# Patient Record
Sex: Male | Born: 1970 | Race: Black or African American | Hispanic: No | Marital: Married | Smoking: Never smoker
Health system: Southern US, Community
[De-identification: ages and names within clinical notes are randomized; demographics above are authoritative.]

---

## 1998-03-31 ENCOUNTER — Emergency Department (HOSPITAL_COMMUNITY): Admission: EM | Admit: 1998-03-31 | Discharge: 1998-03-31 | Payer: Self-pay | Admitting: Internal Medicine

## 2004-08-25 ENCOUNTER — Encounter (INDEPENDENT_AMBULATORY_CARE_PROVIDER_SITE_OTHER): Payer: Self-pay | Admitting: *Deleted

## 2004-08-26 ENCOUNTER — Inpatient Hospital Stay (HOSPITAL_COMMUNITY): Admission: RE | Admit: 2004-08-26 | Discharge: 2004-08-27 | Payer: Self-pay | Admitting: Orthopedic Surgery

## 2006-02-26 ENCOUNTER — Emergency Department (HOSPITAL_COMMUNITY): Admission: EM | Admit: 2006-02-26 | Discharge: 2006-02-27 | Payer: Self-pay | Admitting: Emergency Medicine

## 2007-12-29 ENCOUNTER — Emergency Department (HOSPITAL_COMMUNITY): Admission: EM | Admit: 2007-12-29 | Discharge: 2007-12-30 | Payer: Self-pay | Admitting: Emergency Medicine

## 2008-05-27 ENCOUNTER — Emergency Department (HOSPITAL_COMMUNITY): Admission: EM | Admit: 2008-05-27 | Discharge: 2008-05-28 | Payer: Self-pay | Admitting: Emergency Medicine

## 2008-06-07 ENCOUNTER — Encounter: Admission: RE | Admit: 2008-06-07 | Discharge: 2008-06-07 | Payer: Self-pay | Admitting: Otolaryngology

## 2009-01-17 IMAGING — CT CT ORBIT/TEMPORAL/IAC W/O CM
4 of 6 series · 12 of 30 positions shown, 13 images · non-contrast
Comparison: None.

CLINICAL DATA: Chronic vertigo 3 years.  Question perilymphatic
fistula.

CT ORBIT/TEMPORAL WITHOUT CONTRAST
TECHNIQUE: Contiguous axial images were obtained from the base of
the skull through the vertex according to standard protocol without
contrast.  Multidetector CT imaging of the temporal bones was
performed.  Multiplanar CT image reconstructions were also
generated.

[Series 2: coronal · axial · 0.37mm/px · z∈[+20,+47]mm · 3 of 80 slices shown, 4 images]
[im 20/80  brain]
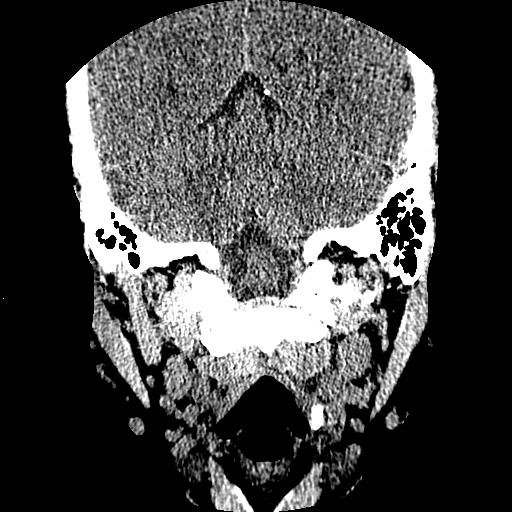
[im 20/80  bone]
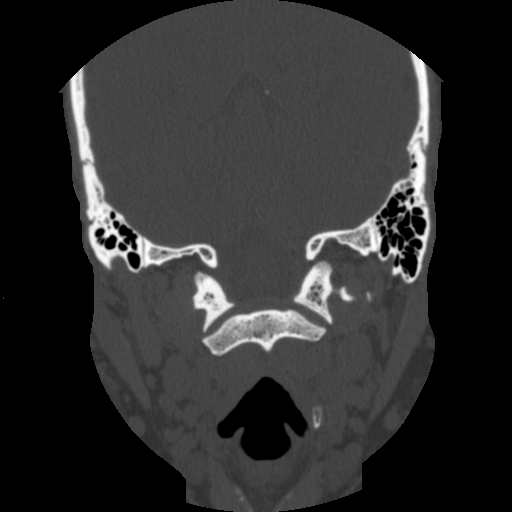
[im 40/80  bone]
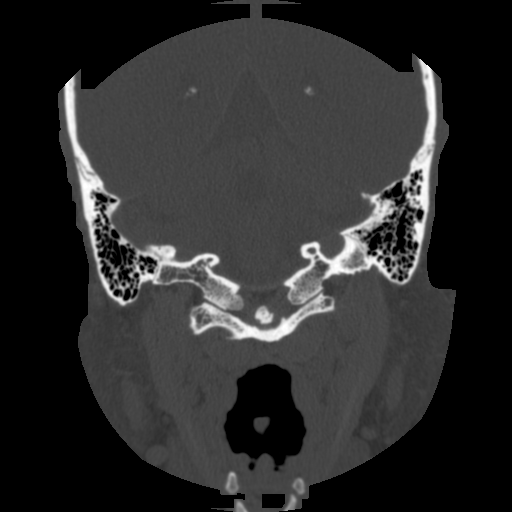
[im 60/80  bone]
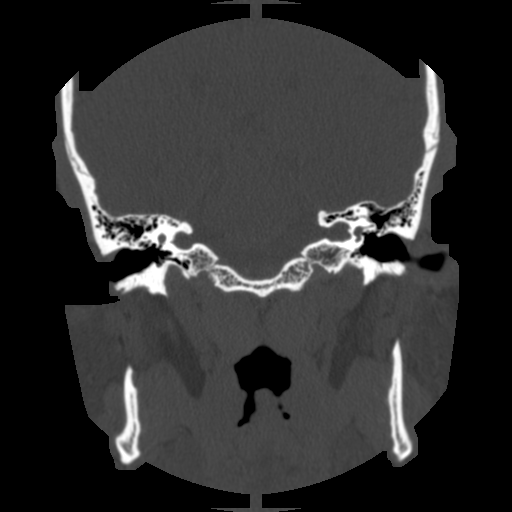

[Series 3: mag/rt/bone · axial · 0.20mm/px · z∈[+4,+31]mm · 3 of 80 slices shown]
[im 20/80  bone]
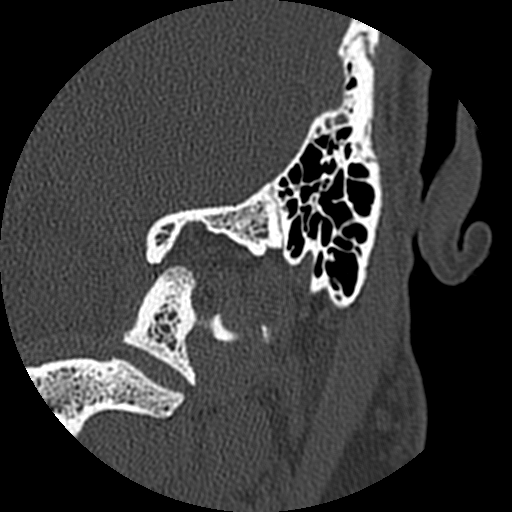
[im 40/80  bone]
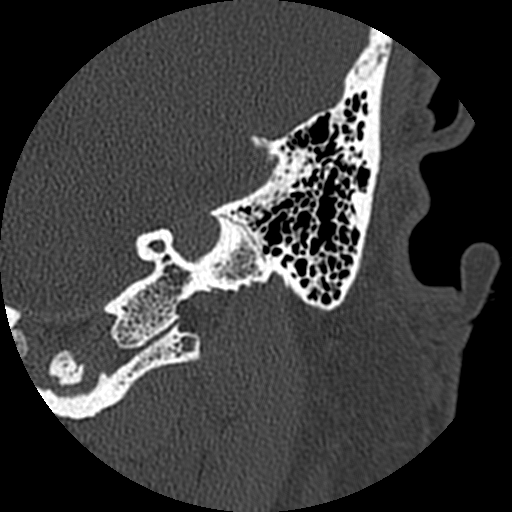
[im 60/80  bone]
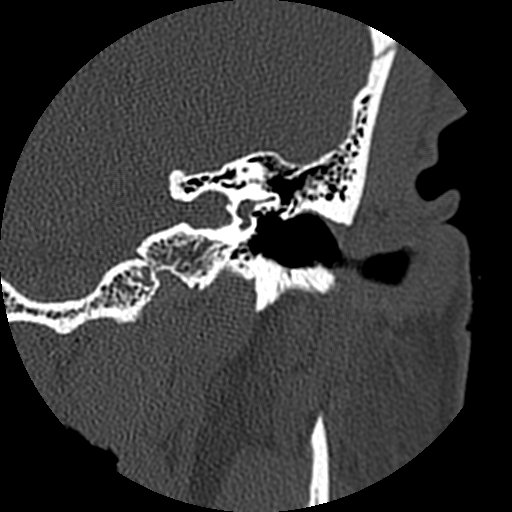

[Series 4: left/mag/bone · axial · 0.20mm/px · z∈[+4,+31]mm · 3 of 80 slices shown]
[im 20/80  bone]
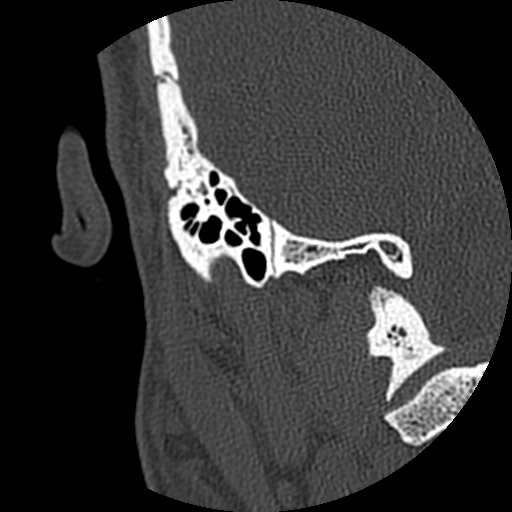
[im 40/80  bone]
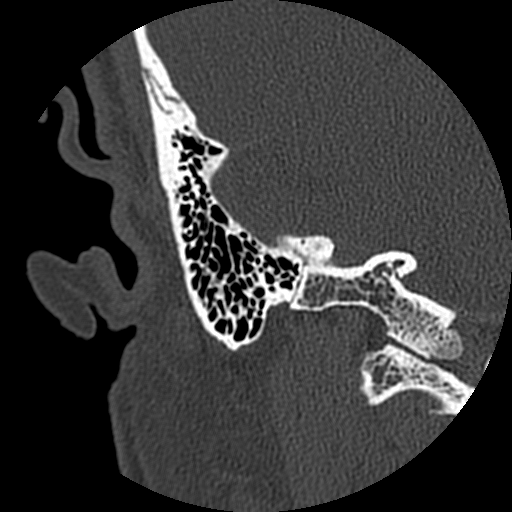
[im 60/80  bone]
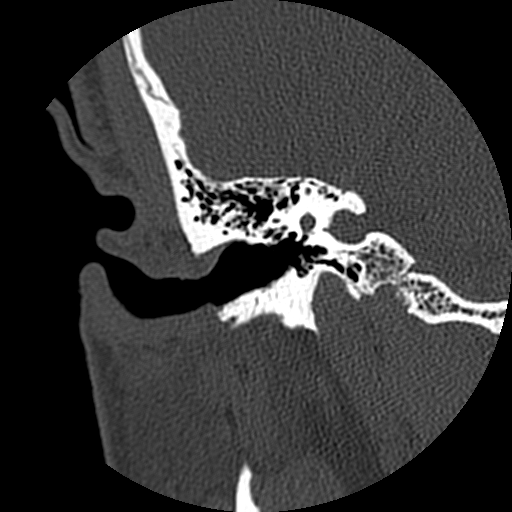

[Series 6: axial detail · axial · 0.37mm/px · z∈[-15,+10]mm · 3 of 80 slices shown]
[im 20/80  bone]
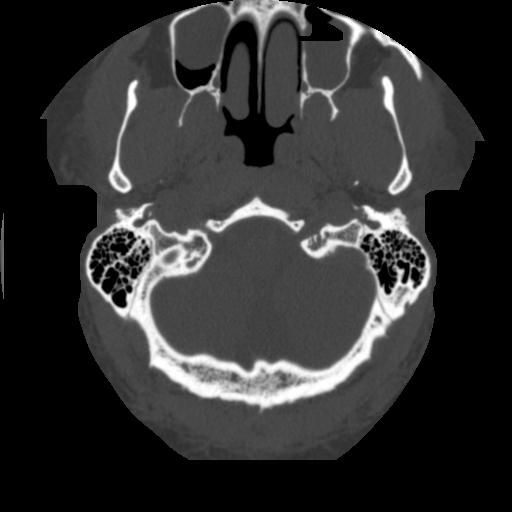
[im 40/80  bone]
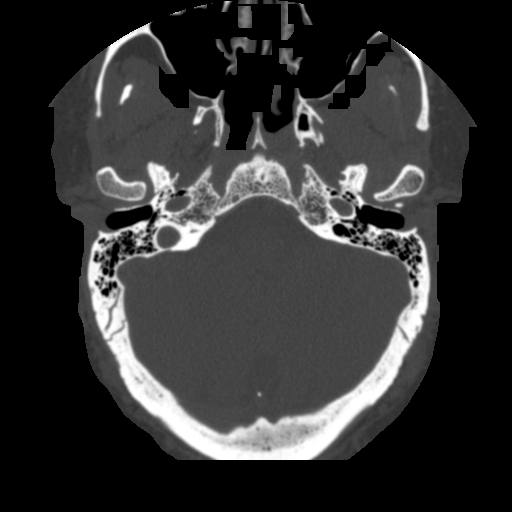
[im 60/80  bone]
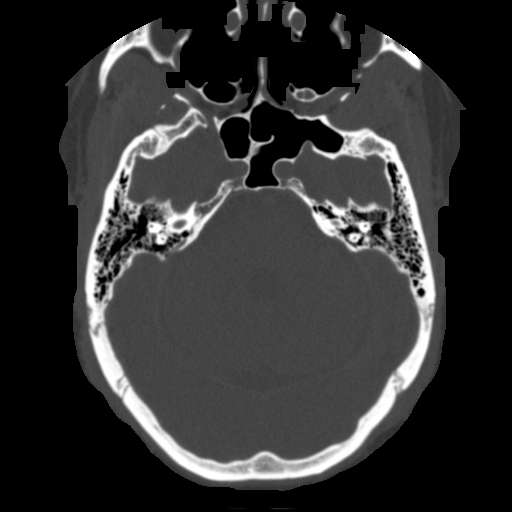

[12 of 30 positions shown; findings below may reference images not displayed]

FINDINGS: External canals widely patent.  No mastoid or middle ear
fluid.  Ossicles are symmetric and normal bilaterally.  No evidence
for cholesteatoma or bony destructive process.

Cochlea and vestibule symmetric and within normal limits.  No
evidence for dilated cochlear or vestibular aqueduct.  No evidence
for encephalocele or tegmen defect.  No evidence for aberrant
vascular structures or congenital deformity.
IMPRESSION: Negative CT temporal bone

## 2009-04-03 ENCOUNTER — Encounter (INDEPENDENT_AMBULATORY_CARE_PROVIDER_SITE_OTHER): Payer: Self-pay | Admitting: *Deleted

## 2009-04-08 ENCOUNTER — Encounter: Payer: Self-pay | Admitting: Family Medicine

## 2009-04-08 ENCOUNTER — Ambulatory Visit: Payer: Self-pay | Admitting: Family Medicine

## 2009-04-08 DIAGNOSIS — J301 Allergic rhinitis due to pollen: Secondary | ICD-10-CM

## 2009-04-08 DIAGNOSIS — I10 Essential (primary) hypertension: Secondary | ICD-10-CM

## 2009-04-10 LAB — CONVERTED CEMR LAB
CO2: 29 meq/L (ref 19–32)
Calcium: 9.7 mg/dL (ref 8.4–10.5)
Chloride: 98 meq/L (ref 96–112)
HDL: 38 mg/dL — ABNORMAL LOW (ref >39)
MCV: 86.3 fL (ref 78.0–100.0)
Platelets: 207 10*3/uL (ref 150–400)
Potassium: 3.7 meq/L (ref 3.5–5.3)
RBC: 5.05 M/uL (ref 4.22–5.81)
Sodium: 139 meq/L (ref 135–145)
Total CHOL/HDL Ratio: 3.9
VLDL: 31 mg/dL (ref 0–40)
WBC: 8.9 10*3/uL (ref 4.0–10.5)

## 2009-06-24 ENCOUNTER — Emergency Department (HOSPITAL_COMMUNITY): Admission: EM | Admit: 2009-06-24 | Discharge: 2009-06-24 | Payer: Self-pay | Admitting: Family Medicine

## 2009-07-16 ENCOUNTER — Telehealth: Payer: Self-pay | Admitting: Family Medicine

## 2009-08-27 ENCOUNTER — Ambulatory Visit: Payer: Self-pay | Admitting: Family Medicine

## 2009-08-27 DIAGNOSIS — K219 Gastro-esophageal reflux disease without esophagitis: Secondary | ICD-10-CM | POA: Insufficient documentation

## 2009-08-27 DIAGNOSIS — E663 Overweight: Secondary | ICD-10-CM | POA: Insufficient documentation

## 2009-08-28 ENCOUNTER — Telehealth: Payer: Self-pay | Admitting: Family Medicine

## 2009-09-25 ENCOUNTER — Telehealth: Payer: Self-pay | Admitting: Family Medicine

## 2009-11-28 ENCOUNTER — Ambulatory Visit: Payer: Self-pay | Admitting: Family Medicine

## 2009-11-28 DIAGNOSIS — G4700 Insomnia, unspecified: Secondary | ICD-10-CM

## 2009-11-28 DIAGNOSIS — F430 Acute stress reaction: Secondary | ICD-10-CM | POA: Insufficient documentation

## 2010-01-07 ENCOUNTER — Encounter: Payer: Self-pay | Admitting: Family Medicine

## 2010-06-29 ENCOUNTER — Telehealth: Payer: Self-pay | Admitting: Family Medicine

## 2010-08-04 ENCOUNTER — Telehealth (INDEPENDENT_AMBULATORY_CARE_PROVIDER_SITE_OTHER): Payer: Self-pay | Admitting: *Deleted

## 2010-08-07 ENCOUNTER — Ambulatory Visit: Payer: Self-pay | Admitting: Family Medicine

## 2010-10-29 NOTE — Assessment & Plan Note (Signed)
Summary: FLU SHOT/KH  Nurse Visit   Vital Signs:  Patient profile:   40 year old male Temp:     98.6 degrees F  Vitals Entered By: Theresia Lo RN (August 07, 2010 2:15 PM)  Allergies: No Known Drug Allergies  Immunizations Administered:  Influenza Vaccine # 1:    Vaccine Type: Fluvax 3+    Site: right deltoid    Mfr: Sanofi Pasteur    Dose: 0.5 ml    Route: IM    Given by: Theresia Lo RN    Exp. Date: 03/27/2011    Lot #: [id number]    VIS given: 04/21/10 version given August 07, 2010.  Flu Vaccine Consent Questions:    Do you have a history of severe allergic reactions to this vaccine? no    Any prior history of allergic reactions to egg and/or gelatin? no    Do you have a sensitivity to the preservative Thimersol? no    Do you have a past history of Guillan-Barre Syndrome? no    Do you currently have an acute febrile illness? no    Have you ever had a severe reaction to latex? no    Vaccine information given and explained to patient? yes  Orders Added: 1)  Flu Vaccine 20yrs + [90658] 2)  Admin 1st Vaccine [82956]

## 2010-10-29 NOTE — Assessment & Plan Note (Signed)
Summary: Stress/insomnia/HTN   Vital Signs:  Patient profile:   40 year old male Height:      70 inches Weight:      264.0 pounds BMI:     38.02 Temp:     98.0 degrees F oral Pulse rate:   84 / minute BP sitting:   133 / 89  (right arm) Cuff size:   large  Vitals Entered By: Gladstone Pih (November 28, 2009 10:38 AM) CC: C/O stress Is Patient Diabetic? No Pain Assessment Patient in pain? no        Primary Care Provider:  Marisue Ivan, MD  CC:  C/O stress.  History of Present Illness: 40yo M to discuss stress  Stress: Reports worsening stress x 1-2 months due to financial reasons.  His wife is unemployed and has not been able to find a job.  It is affecting his sleep, libido, and concentration.  No SI/HI.     Habits & Providers  Alcohol-Tobacco-Diet     Tobacco Status: never  Current Medications (verified): 1)  Chlorthalidone 25 Mg Tabs (Chlorthalidone) .... One Tablet By Mouth Daily 2)  Omeprazole 40 Mg Cpdr (Omeprazole) .Marland Kitchen.. 1 Tab By Mouth Daily 3)  Lunesta 2 Mg Tabs (Eszopiclone) .Marland Kitchen.. 1 Tab By Mouth At Bedtime As Needed Insomnia  Allergies (verified): No Known Drug Allergies  Social History: Smoking Status:  never  Review of Systems       It is affecting his sleep, libido, and concentration.  Physical Exam  General:  VS Reviewed. Well appearing, NAD.  Psych:  Endorses insomnia and lack of concentration no depression   Impression & Recommendations:  Problem # 1:  INSOMNIA (ICD-780.52) Assessment New  Acute.  Likely secondary to acute stress reaction.   We discussed his sleep hygiene.  No caffeine intake in the evening.  No activities in bed besides sex and sleep.  No late exercise.   Advised to continue with daily exercise. Will provide 1 month supply of Lunesta with 1 refill.  He has used this in the past for a short period without any adverse reactions.  His updated medication list for this problem includes:    Lunesta 2 Mg Tabs  (Eszopiclone) .Marland Kitchen... 1 tab by mouth at bedtime as needed insomnia  Orders: [facility]- Est  Level 4 (16109)  Problem # 2:  STRESS REACTION, ACUTE (ICD-308.9) Assessment: New  Situational.  Due to financial stresses. No harm to self or others. No signs of anxiety or depression. He has healthy coping mechanisms. He is to call me if symptoms worsen or change.  Orders: [facility]- Est  Level 4 (60454)  Problem # 3:  ESSENTIAL HYPERTENSION, BENIGN (ICD-401.1) Assessment: Unchanged  At goal (<140/90). No changes to regimen.  His updated medication list for this problem includes:    Chlorthalidone 25 Mg Tabs (Chlorthalidone) ..... One tablet by mouth daily  Orders: [facility]- Est  Level 4 (09811)  Complete Medication List: 1)  Chlorthalidone 25 Mg Tabs (Chlorthalidone) .... One tablet by mouth daily 2)  Omeprazole 40 Mg Cpdr (Omeprazole) .Marland Kitchen.. 1 tab by mouth daily 3)  Lunesta 2 Mg Tabs (Eszopiclone) .Marland Kitchen.. 1 tab by mouth at bedtime as needed insomnia  Patient Instructions: 1)  Call me if this continues despite the situation improving.   Prescriptions: CHLORTHALIDONE 25 MG TABS (CHLORTHALIDONE) one tablet by mouth daily  #90 x 1   Entered and Authorized by:   Marisue Ivan  MD   Signed by:   Marisue Ivan  MD  on 11/28/2009   Method used:   [facility]        [facility]  [address] [phone][facility] (retail)       [address]       [place], [place]  [id number]       Ph: [phone] or [phone]       Fax: [phone]   RxID:   [id number] LUNESTA 2 MG TABS (ESZOPICLONE) 1 tab by mouth at bedtime as needed insomnia  #30 x 1   Entered and Authorized by:   Marisue Ivan  MD   Signed by:   Marisue Ivan  MD on 11/28/2009   Method used:   Print then Give to Patient   RxID:   [id number]    Prevention & Chronic Care Immunizations   Influenza vaccine: refused  (04/08/2009)   Influenza vaccine deferral: Refused  (11/28/2009)   Influenza vaccine  due: 04/08/2010    Tetanus booster: 09/28/2007: given   Tetanus booster due: 09/27/2017    Pneumococcal vaccine: Not documented  Other Screening   Smoking status: never  (11/28/2009)  Lipids   Total Cholesterol: 147  (04/08/2009)   LDL: 78  (04/08/2009)   LDL Direct: Not documented   HDL: 38  (04/08/2009)   Triglycerides: 155  (04/08/2009)  Hypertension   Last Blood Pressure: 133 / 89  (11/28/2009)   Serum creatinine: 1.26  (04/08/2009)   Serum potassium 3.7  (04/08/2009)    Hypertension flowsheet reviewed?: Yes   Progress toward BP goal: At goal  Self-Management Support :   Personal Goals (by the next clinic visit) :      Personal blood pressure goal: 140/90  (08/27/2009)   Patient will work on the following items until the next clinic visit to reach self-care goals:     Medications and monitoring: take my medicines every day, check my blood pressure  (11/28/2009)     Eating: drink diet soda or water instead of juice or soda, eat more vegetables, use fresh or frozen vegetables, eat foods that are low in salt, eat baked foods instead of fried foods, eat fruit for snacks and desserts, limit or avoid alcohol  (08/27/2009)    Hypertension self-management support: Written self-care plan  (11/28/2009)   Hypertension self-care plan printed.

## 2010-10-29 NOTE — Progress Notes (Signed)
Summary: Rx Req  Phone Note Refill Request Call back at [facility] Phone [phone] Message from:  WIFE SONYA  Refills Requested: Medication #1:  CHLORTHALIDONE 25 MG TABS one tablet by mouth daily [facility]. [address]  PHONE NUMBER IS [phone]. FAX NUMBER IS [phone]  PT IS THERE FOR 6 WEEKS.    Initial call taken by: Clydell Hakim,  June 29, 2010 8:46 AM  Follow-up for Phone Call        to pcp Follow-up by: Golden Circle RN,  June 29, 2010 9:32 AM  Additional Follow-up for Phone Call Additional follow up Details #1::        Called in prescription to pharmacy Additional Follow-up by: Renold Don MD,  June 30, 2010 4:57 PM

## 2010-10-29 NOTE — Progress Notes (Signed)
  Phone Note Call from Patient   Caller: Spouse-Sonya Call For: [phone] Summary of Call: Neck pain x 3 days.  Need to be seen asap Initial call taken by: Abundio Miu,  August 04, 2010 1:44 PM  Follow-up for Phone Call        message left to call back Follow-up by: Theresia Lo RN,  August 04, 2010 1:54 PM  Additional Follow-up for Phone Call Additional follow up Details #1::        spoke with wife and she states patient ask her to call for appointment for Friday. PCP not in clinic Friday  explained that I can only make appointment 24 hours in advance  for work in. she doesn't know anything about his pain except that he has been taking ibuprofen for the pain and pain started 3 days ago. . he thinks maybe he has pulled a muscle.  she states he is at work now and cannot come today anyway. appointment scheduled for  tomorrow. Additional Follow-up by: Theresia Lo RN,  August 04, 2010 2:53 PM

## 2010-10-29 NOTE — Miscellaneous (Signed)
Summary: prior auth  Clinical Lists Changes a pa for omeprazole to pcp to complete & sign, or can change med.Golden Circle RN  January 07, 2010 11:01 AM  PA completed.......Marland KitchenMarisue Ivan, MD  Appended Document: prior auth approved by state health plan

## 2010-11-15 ENCOUNTER — Encounter: Payer: Self-pay | Admitting: *Deleted

## 2010-11-18 ENCOUNTER — Ambulatory Visit (INDEPENDENT_AMBULATORY_CARE_PROVIDER_SITE_OTHER): Payer: BC Managed Care – PPO | Admitting: Sports Medicine

## 2010-11-18 ENCOUNTER — Encounter: Payer: Self-pay | Admitting: Sports Medicine

## 2010-11-18 VITALS — BP 127/78 | HR 76 | Temp 97.8°F | Ht 72.0 in | Wt 278.6 lb

## 2010-11-18 DIAGNOSIS — M5416 Radiculopathy, lumbar region: Secondary | ICD-10-CM | POA: Insufficient documentation

## 2010-11-18 DIAGNOSIS — IMO0002 Reserved for concepts with insufficient information to code with codable children: Secondary | ICD-10-CM

## 2010-11-18 MED ORDER — GABAPENTIN 300 MG PO CAPS
ORAL_CAPSULE | ORAL | Status: AC
Start: 2010-11-18 — End: ?

## 2010-11-18 MED ORDER — TRAMADOL HCL 50 MG PO TABS: 50.0000 mg | ORAL_TABLET | Freq: Four times a day (QID) | ORAL | Status: AC | PRN

## 2010-11-18 MED ORDER — PREDNISONE (PAK) 10 MG PO TABS: ORAL_TABLET | Freq: Every day | ORAL | Status: AC

## 2010-11-18 NOTE — Patient Instructions (Addendum)
Prednisone 12d taper Gabapentin up taper Tramadol for pain as needed. Rehab exercises. Heating pad 20 mins 3x a day. Limit prolonged standing/sitting >20-30 mins. No stooping, heavy lifting >10 lbs.  Come back to see me if not getting better in 3 weeks.  -Dr. Karie Schwalbe.
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
## 2010-11-18 NOTE — Assessment & Plan Note (Addendum)
Suspect L2/L3 forminal impingement. No weakness or signs cord compression or cauda equina. Prednisone 12d taper Gabapentin up taper Tramadol. Rehab exercises. Heating pad 20 mins TID. Limit prolonged standing/sitting >20-30 mins. No stooping, heavy lifting >10 lbs.

## 2011-02-05 ENCOUNTER — Other Ambulatory Visit: Payer: Self-pay | Admitting: Sports Medicine

## 2011-02-05 MED ORDER — ESOMEPRAZOLE MAGNESIUM 40 MG PO CPDR: 40.0000 mg | DELAYED_RELEASE_CAPSULE | Freq: Every day | ORAL | Status: DC

## 2011-02-12 NOTE — Op Note (Signed)
NAMEKURT, Anthony Walsh NO.:  [id number]   MEDICAL RECORD NO.:  [id number]          PATIENT TYPE:  OIB   LOCATION:  5013                         FACILITY:  [facility]   PHYSICIAN:  Dionne Ano. Gramig III, M.D.DATE OF BIRTH:  01-Nov-1970   DATE OF PROCEDURE:  08/26/2004  DATE OF DISCHARGE:                                 OPERATIVE REPORT   PREOPERATIVE DIAGNOSIS:  Left ankle fracture subluxation with type C fibula  fracture, syndesmosis injury, medial clear space widening and deltoid  ligament injury.   POSTOPERATIVE DIAGNOSIS:  Left ankle fracture subluxation with type C fibula  fracture, syndesmosis injury, medial clear space widening and deltoid  ligament injury with associated chondral injury noted about the talar dome  medially and loose nonviable chondral fragments present in the joint.   OPERATION PERFORMED:  1.  Open reduction internal fixation of fibular fracture, left ankle.  2.  Syndesmosis screw fixation, left ankle secondary to syndesmosis injury.  3.  Deltoid ligament repair, left ankle.  4.  Arthrotomy with debridement, left ankle joint medially and removal of      nonviable chondral defects 3 x 2 mm in size times two.  5.  Stress radiography.   SURGEON:  Dionne Ano. Amanda Pea, M.D.   ASSISTANT:  Karie Chimera, P.A.-C.   ANESTHESIA:  General.   COMPLICATIONS:  None.   TOURNIQUET TIME:  Less than two hours.   ESTIMATED BLOOD LOSS:  Minimal.   SPECIMENS:  One.   INDICATIONS FOR PROCEDURE:  The patient presented to my office with a  fracture subluxation of his ankle.  He was noted to have widening of his  medial clear space and a type C fibular fracture with noted syndesmosis  injury.  I discussed with him, the options for treatment and prepped him for  surgery.  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  He presented to the holding area in stable condition without signs  of infection or dystrophy.  Prior to his  surgery, we splinted him in a  plaster of Paris cast.  I discussed with him the risks of bleeding,  infection, anesthesia, damage to normal structures and failure of the  surgery to accomplish its intended goals of relieving symptoms and restoring  function.  With this in mind, he desires to proceed.  All questions have  been encouraged and answered preoperatively.   OPERATIVE FINDINGS:  This patient had type C fibular fracture with  syndesmosis widening.  He underwent open reduction internal fixation of the  fibula.  Syndesmosis screw fixation due to the instability and repair of the  superficial deltoid ligament with associated ankle joint debridement  medially where there were chondral defects noted and loose bodies.  He  tolerated the procedure well and there were no complicating features.  He  had excellent stability at the conclusion of the case.   DESCRIPTION OF PROCEDURE:  The patient was seen by myself and anesthesia,  was taken to the operative suite, underwent prophylactic antibiotic  administration, was  laid supine on the operating table, underwent a general  anesthetic  under the direction of E. Jairo Ben, M.D., was laid supine  appropriately padded, had a bump placed under the left hip and then  underwent a thorough Betadine scrub and paint followed by drying process,  followed by placement of DuraPrep on the skin.  Once this was done, the  patient then had the leg elevated, sterile drapes were secured and  tourniquet was insufflated to 300 mmHg.  Once this was done, an incision was  made laterally, taking care to avoid superficial peroneal nerve branches and  sural nerve branches.  We made an incision and bluntly dissected down to the  fracture site.  Once this was done, retractors were placed.  The fracture  was cleaned off nicely and the fracture was then irrigated copiously.  This  patient had a long oblique fracture with type C distribution in terms of the   Weber classification. The patient had provisional fixation with clamps  applied followed by placement of interfragmentary screws across the oblique  site.  This was done with standard AO technique overdrilling the proximal  cortex with a 35 drill bit, the distal cortex was drilled with a 25 drill  bit and excellent compression of the fracture surfaces was obtained.  This  held the oblique portion nicely.  Given the man's very large ankle, I then  contoured a pelvic reconstruction plate off of the fibula allowing 6  cortices proximal to the fracture site and taking the plate down to the tip  of the fibula.  This plate was precontoured, placed against the side of the  fibula and served, of course, as the buttress and antirotation plate.  The  distal  holes were filled with fully threaded cancellous screws, the  proximal holes were bicortical screws.  Excellent purchase and excellent  contour was provided.  At this point I then stress tested the ankle with a  cotton test and noted that the medial clear space was still able to be  widened.  Thus I felt that syndesmosis screw fixation and look medially was  necessary.  Given the fact that he did not have perfect stability with the  stress test was an indication of syndesmosis injury and thus at this time we  made a medial incision, dissected down and noted that he did have widening  medially still present dynamically when loaded.  I removed superficial  deltoid ligament which was infolded slightly in the joint.  Following this,  I then debrided the joint, performed an arthrotomy so that I could look at  the talus.  Unfortunately the talus dome was scuffed by the injury  significantly and there were two loose chondral pieces, 2 x 3 mm in nature  which were removed without difficulty.  This was sent for specimen times  one.  The patient had the joint thoroughly irrigated.  I swept the area to make sure there was no infolded tissue and following  this, placed the ankle  in dorsiflexion, reduced the medial clear space and then placed a  syndesmosis screw from a remaining hole left free in the plate for this  purpose.  This was done by overdrilling the fibula cortices with 35 drill  bit and then engaging the tibia with a 25 drill bit.  Following this, I  placed a fully threaded cortical screw size 40 approximately with the ankle  reduced in the incisural notch.  The fibula was reduced nicely in the  incisural notch and the medial clear space was tightened nicely.  Following  this, I stress tested the ankle once again and it was noted to be in  excellent position.  I was pleased with this and the findings and at this  time repaired the deltoid ligament superficially with buried #2 FiberWire.  This was done tightly and without difficulty.  Following this, I then  deflated the tourniquet, obtained hemostasis with bipolar electrocautery and  regular cautery and irrigated copiously followed by a complex closure with 2-  0 Vicryl in the deep and superficial tissues over the lateral side of the  ankle followed by staple clips in the skin edge.  He closed nicely here.  The medial side was closed with Prolene at the skin edge after a few Vicryl  were placed in the subcu.  The patient tolerated this well and there no  complicating features.  He had excellent refill, good pulse, no signs of  compartment syndrome or other problems. We treated him with Xeroform  followed by Neosporin, followed by standard dressing and plaster of Paris  splint with derotation ankle stirrup utilizing plaster of Paris slabs.  The  patient tolerated this well, was extubated, taken to the recovery room and  monitored.  He will be given  additional Ancef and monitored closely.  He will return to the office and  see [place] in 10 days after his discharge from the hospital. He will be admitted  for observation and pain management, IV antibiotics etc.  I have discussed  with  the patient his findings, all issues, went over the x-rays as well.  All questions have been encouraged and answered.       WMG/MEDQ  D:  08/26/2004  T:  08/26/2004  Job:  [id number]

## 2011-02-15 ENCOUNTER — Telehealth: Payer: Self-pay | Admitting: *Deleted

## 2011-02-15 NOTE — Telephone Encounter (Signed)
PA required for omeprazole. Insurance allows # 90 tabs for 180 days . Will need to do a PA. Form placed in MD box.

## 2011-02-17 NOTE — Telephone Encounter (Signed)
Approval received from insurance for Omeprazole from 01/26/2011 thru 02/16/2012. Pharmacy notified

## 2011-06-14 ENCOUNTER — Other Ambulatory Visit: Payer: Self-pay | Admitting: Family Medicine

## 2011-06-14 MED ORDER — CHLORTHALIDONE 25 MG PO TABS: 25.0000 mg | ORAL_TABLET | Freq: Every day | ORAL | Status: DC

## 2011-08-02 ENCOUNTER — Ambulatory Visit (INDEPENDENT_AMBULATORY_CARE_PROVIDER_SITE_OTHER): Payer: BC Managed Care – PPO

## 2011-08-02 DIAGNOSIS — Z23 Encounter for immunization: Secondary | ICD-10-CM

## 2011-09-10 ENCOUNTER — Encounter: Payer: BC Managed Care – PPO | Admitting: Family Medicine

## 2011-09-23 ENCOUNTER — Other Ambulatory Visit: Payer: Self-pay | Admitting: Family Medicine

## 2011-09-24 NOTE — Telephone Encounter (Signed)
Refill request

## 2011-09-24 NOTE — Telephone Encounter (Signed)
Refill request for patient of Dr.Ritch 

## 2011-10-08 ENCOUNTER — Encounter: Payer: BC Managed Care – PPO | Admitting: Family Medicine

## 2011-12-27 ENCOUNTER — Other Ambulatory Visit: Payer: Self-pay | Admitting: Family Medicine

## 2012-03-10 ENCOUNTER — Other Ambulatory Visit: Payer: Self-pay | Admitting: Family Medicine

## 2012-03-10 MED ORDER — ESOMEPRAZOLE MAGNESIUM 40 MG PO CPDR: 40.0000 mg | DELAYED_RELEASE_CAPSULE | Freq: Every day | ORAL | Status: AC

## 2012-03-15 ENCOUNTER — Telehealth: Payer: Self-pay | Admitting: *Deleted

## 2012-03-15 NOTE — Telephone Encounter (Signed)
Attempted calling patient to notify about Nexium . All phone number listed are not in service. Will await call  from patient regarding this.

## 2012-03-15 NOTE — Telephone Encounter (Signed)
Does not qualify.  Will need to purchase OTC or make specific office appointment to discuss options.

## 2012-03-15 NOTE — Telephone Encounter (Signed)
PA required for Nexium. Form placed in MD box. 

## 2012-03-27 ENCOUNTER — Other Ambulatory Visit: Payer: Self-pay | Admitting: *Deleted

## 2012-03-31 ENCOUNTER — Telehealth: Payer: Self-pay | Admitting: Family Medicine

## 2012-03-31 MED ORDER — CHLORTHALIDONE 25 MG PO TABS: 25.0000 mg | ORAL_TABLET | Freq: Every day | ORAL | Status: AC

## 2012-03-31 NOTE — Telephone Encounter (Signed)
Patient's wife called for refill of chlorathalidone. She asked why his medication did not have additional refills. I informed her that patient has not been seen in some time for HTN f/u and he will need a f/u appt for additional refills. I informed her that I will send in a 30 days supply today to get him to his appt. She voiced understanding.

## 2012-04-29 ENCOUNTER — Other Ambulatory Visit: Payer: Self-pay | Admitting: Family Medicine

## 2012-05-05 ENCOUNTER — Other Ambulatory Visit: Payer: Self-pay | Admitting: Family Medicine

## 2012-08-25 ENCOUNTER — Ambulatory Visit (INDEPENDENT_AMBULATORY_CARE_PROVIDER_SITE_OTHER): Payer: BC Managed Care – PPO | Admitting: Family Medicine

## 2012-08-25 ENCOUNTER — Encounter: Payer: Self-pay | Admitting: Family Medicine

## 2012-08-25 VITALS — BP 138/80 | HR 77 | Temp 98.2°F | Resp 16 | Ht 70.5 in | Wt 255.0 lb

## 2012-08-25 DIAGNOSIS — B356 Tinea cruris: Secondary | ICD-10-CM

## 2012-08-25 DIAGNOSIS — B372 Candidiasis of skin and nail: Secondary | ICD-10-CM

## 2012-08-25 MED ORDER — CLOTRIMAZOLE 1 % EX CREA: TOPICAL_CREAM | Freq: Two times a day (BID) | CUTANEOUS | Status: AC

## 2012-08-25 MED ORDER — TERBINAFINE HCL 1 % EX CREA: TOPICAL_CREAM | Freq: Every day | CUTANEOUS | Status: DC

## 2012-08-25 NOTE — Patient Instructions (Signed)
See the handout given for information on tinea cruris.  Apply the cream each day for 1 week, then if not resolved, recheck, or follow up with your primary care doctor.  Return to the clinic or go to the nearest emergency room if any of your symptoms worsen or new symptoms occur.

## 2012-08-25 NOTE — Progress Notes (Signed)
  Subjective:    Patient ID: Anthony Walsh, male    DOB: December 17, 1970, 41 y.o.   MRN: [id number]  HPI Anthony Walsh is a 41 y.o. male ? Fungal infection - treated at primary provider 2 weeks ago - treated twice per day with nystatin cream for 2 weeks.  Didn't notice much improvement.  Tried terconazole? cream  - few nights ago - none in past 2 days.  some improvement with terconazole.. Notices rash on inner thighs and end of penis.  No dysuria, no scrotal lesions now - had this initially, no testicular pain. No other rash.   Axe body wash.   Review of Systems  Constitutional: Negative for fever, chills, diaphoresis, fatigue and unexpected weight change.  Genitourinary: Positive for genital sores (slight itching at end of penis with cracking skin. ). Negative for dysuria, frequency, discharge, penile swelling, scrotal swelling and difficulty urinating.  Skin: Positive for rash.   Engineer, drilling.      Objective:   Physical Exam  Vitals reviewed. Constitutional: He is oriented to person, place, and time. He appears well-developed.  Pulmonary/Chest: Effort normal.  Neurological: He is alert and oriented to person, place, and time.  Skin:     Psychiatric: He has a normal mood and affect. His behavior is normal.        Assessment & Plan:  Anthony Walsh is a 41 y.o. male 1. Tinea cruris  terbinafine (LAMISIL AT) 1 % cream   possible candidal infection with scrotal/penile involvement. Now improving somewhat after single dose of azole?  Will treat with topicalazole QD for 1 week (initially discussed terbinafine, but clotrimazole appears to be better option for tinea and candida. Advised pharmacist and left message on patient's cell phone number provided). handout given on tinea prevention and treatment, and recheck in next 1 week if not improving.  Sooner if worse.  Patient Instructions  See the handout given for information on tinea cruris.  Apply the cream each day  for 1 week, then if not resolved, recheck, or follow up with your primary care doctor.  Return to the clinic or go to the nearest emergency room if any of your symptoms worsen or new symptoms occur.

## 2018-06-29 ENCOUNTER — Ambulatory Visit (INDEPENDENT_AMBULATORY_CARE_PROVIDER_SITE_OTHER): Payer: BC Managed Care – PPO | Admitting: Neurology

## 2018-06-29 ENCOUNTER — Ambulatory Visit: Payer: BC Managed Care – PPO | Admitting: Neurology

## 2018-06-29 DIAGNOSIS — G5603 Carpal tunnel syndrome, bilateral upper limbs: Secondary | ICD-10-CM | POA: Diagnosis not present

## 2018-06-29 DIAGNOSIS — Z0289 Encounter for other administrative examinations: Secondary | ICD-10-CM

## 2018-06-29 NOTE — Progress Notes (Signed)
Full Name: Anthony Walsh Gender: Male MRN #: [id number] Date of Birth: 02-May-2071    Visit Date: 06/29/18 08:10 Age: 47 Years 0 Months Old Examining Physician: Naomie Dean, MD  Referring Physician: Lupe Carney, MD    History:  Patient referred for evaluation of Carpal Tunnel Syndrome  Summary: EMG/NCS was performed on the bilateral upper extremities.  The right median APB motor nerve showed prolonged distal onset latency (6.5 ms, N<4.4). The left  median APB motor nerve showed prolonged distal onset latency (4.7 ms, N<4.4). The right Median 2nd Digit orthodromic sensory nerve showed prolonged distal peak latency (5.35ms, N<3.4) and reduced amplitude(2 uV, N>10). The left  Median 2nd Digit orthodromic sensory nerve showed prolonged distal peak latency (4.7 ms, N<3.4) and reduced amplitude(4 uV, N>10)  The right median/ulnar (palm) comparison nerve showed prolonged distal peak latency (Median Palm, 4.3 ms, N<2.2) and abnormal peak latency difference (Median Palm-Ulnar Palm, 2.2 ms, N<0.4) with a relative median delay.    The left median/ulnar (palm) comparison nerve showed prolonged distal peak latency (Median Palm, 3.6 ms, N<2.2) and abnormal peak latency difference (Median Palm-Ulnar Palm, 1.8 ms, N<0.4) with a relative median delay.    All remaining nerves (as detailed in the following table) were within normal limits. All muscles within normal limits.  Conclusion: There is bilateral right > left Carpal Tunnel Syndrome.   Cc: Lupe Carney, MD ------------------------------- Naomie Dean, M.D.  [facility] [address] [place], [place] [address] Tel: [phone] Fax: [phone]        [facility]    Nerve / Sites Muscle Latency Ref. Amplitude Ref. Rel Amp Segments Distance Velocity Ref. Area    ms ms mV mV %  cm m/s m/s mVms  L Median - APB     Wrist APB 4.7 ?4.4 6.6 ?4.0 100 Wrist - APB 7   24.6     Upper arm APB 9.8  6.4  96.3 Upper arm - Wrist 25  49 ?49 24.3  R Median - APB     Wrist APB 6.5 ?4.4 6.2 ?4.0 100 Wrist - APB 7   24.6     Upper arm APB 11.5  5.9  94.6 Upper arm - Wrist 25 50 ?49 24.3  L Ulnar - ADM     Wrist ADM 2.7 ?3.3 11.8 ?6.0 100 Wrist - ADM 7   33.2     B.Elbow ADM 7.3  10.7  91.2 B.Elbow - Wrist 23 50 ?49 32.4     A.Elbow ADM 9.1  10.5  97.7 A.Elbow - B.Elbow 10 55 ?49 32.0         A.Elbow - Wrist      R Ulnar - ADM     Wrist ADM 2.6 ?3.3 12.2 ?6.0 100 Wrist - ADM 7   33.2     B.Elbow ADM 7.1  11.1  90.7 B.Elbow - Wrist 23 50 ?49 32.6     A.Elbow ADM 9.0  10.6  95.5 A.Elbow - B.Elbow 10 55 ?49 31.6         A.Elbow - Wrist                 SNC    Nerve / Sites Rec. Site Peak Lat Ref.  Amp Ref. Segments Distance Peak Diff Ref.    ms ms V V  cm ms ms  L Median, Ulnar - Transcarpal comparison     Median Palm Wrist 3.6 ?2.2 10 ?35 Median Palm -  Wrist 8       Ulnar Palm Wrist 2.0 ?2.2 8 ?12 Ulnar Palm - Wrist 8          Median Palm - Ulnar Palm  1.6 ?0.4  R Median, Ulnar - Transcarpal comparison     Median Palm Wrist 4.3 ?2.2 5 ?35 Median Palm - Wrist 8       Ulnar Palm Wrist 2.1 ?2.2 9 ?12 Ulnar Palm - Wrist 8          Median Palm - Ulnar Palm  2.2 ?0.4  L Median - Orthodromic (Dig II, Mid palm)     Dig II Wrist 4.7 ?3.4 4 ?10 Dig II - Wrist 13    R Median - Orthodromic (Dig II, Mid palm)     Dig II Wrist 5.8 ?3.4 2 ?10 Dig II - Wrist 13    L Ulnar - Orthodromic, (Dig V, Mid palm)     Dig V Wrist 2.8 ?3.1 6 ?5 Dig V - Wrist 11    R Ulnar - Orthodromic, (Dig V, Mid palm)     Dig V Wrist 2.8 ?3.1 13 ?5 Dig V - Wrist 31                   F  Wave    Nerve F Lat Ref.   ms ms  L Ulnar - ADM 31.9 ?32.0  R Ulnar - ADM 31.2 ?32.0         EMG full       EMG Summary Table    Spontaneous MUAP Recruitment  Muscle IA Fib PSW Fasc Other Amp Dur. Poly Pattern  L. Deltoid Normal None None None _______ Normal Normal Normal Normal  R. Deltoid Normal None None None _______ Normal Normal Normal Normal  L. Triceps  brachii Normal None None None _______ Normal Normal Normal Normal  R. Triceps brachii Normal None None None _______ Normal Normal Normal Normal  L. Pronator teres Normal None None None _______ Normal Normal Normal Normal  R. Pronator teres Normal None None None _______ Normal Normal Normal Normal  L. First dorsal interosseous Normal None None None _______ Normal Normal Normal Normal  R. First dorsal interosseous Normal None None None _______ Normal Normal Normal Normal  L. Opponens pollicis Normal None None None _______ Normal Normal Normal Normal  R. Opponens pollicis Normal None None None _______ Normal Normal Normal Normal

## 2018-06-29 NOTE — Progress Notes (Signed)
See procedure note.

## 2018-07-03 DIAGNOSIS — G5603 Carpal tunnel syndrome, bilateral upper limbs: Secondary | ICD-10-CM | POA: Insufficient documentation

## 2018-07-03 NOTE — Procedures (Signed)
Full Name: Anthony Walsh Gender: Male MRN #: [id number] Date of Birth: Mar 20, 2071    Visit Date: 06/29/18 08:10 Age: 47 Years 0 Months Old Examining Physician: Naomie Dean, MD  Referring Physician: Lupe Carney, MD    History:  Patient referred for evaluation of Carpal Tunnel Syndrome  Summary: EMG/NCS was performed on the bilateral upper extremities.  The right median APB motor nerve showed prolonged distal onset latency (6.5 ms, N<4.4). The left  median APB motor nerve showed prolonged distal onset latency (4.7 ms, N<4.4). The right Median 2nd Digit orthodromic sensory nerve showed prolonged distal peak latency (5.73ms, N<3.4) and reduced amplitude(2 uV, N>10). The left  Median 2nd Digit orthodromic sensory nerve showed prolonged distal peak latency (4.7 ms, N<3.4) and reduced amplitude(4 uV, N>10)  The right median/ulnar (palm) comparison nerve showed prolonged distal peak latency (Median Palm, 4.3 ms, N<2.2) and abnormal peak latency difference (Median Palm-Ulnar Palm, 2.2 ms, N<0.4) with a relative median delay.    The left median/ulnar (palm) comparison nerve showed prolonged distal peak latency (Median Palm, 3.6 ms, N<2.2) and abnormal peak latency difference (Median Palm-Ulnar Palm, 1.8 ms, N<0.4) with a relative median delay.    All remaining nerves (as detailed in the following table) were within normal limits. All muscles within normal limits.  Conclusion: There is bilateral right > left Carpal Tunnel Syndrome.   Cc: Lupe Carney, MD ------------------------------- Naomie Dean, M.D.  [facility] [address] [place], [place] [address] Tel: [phone] Fax: [phone]        [facility]    Nerve / Sites Muscle Latency Ref. Amplitude Ref. Rel Amp Segments Distance Velocity Ref. Area    ms ms mV mV %  cm m/s m/s mVms  L Median - APB     Wrist APB 4.7 ?4.4 6.6 ?4.0 100 Wrist - APB 7   24.6     Upper arm APB 9.8  6.4  96.3 Upper arm - Wrist 25  49 ?49 24.3  R Median - APB     Wrist APB 6.5 ?4.4 6.2 ?4.0 100 Wrist - APB 7   24.6     Upper arm APB 11.5  5.9  94.6 Upper arm - Wrist 25 50 ?49 24.3  L Ulnar - ADM     Wrist ADM 2.7 ?3.3 11.8 ?6.0 100 Wrist - ADM 7   33.2     B.Elbow ADM 7.3  10.7  91.2 B.Elbow - Wrist 23 50 ?49 32.4     A.Elbow ADM 9.1  10.5  97.7 A.Elbow - B.Elbow 10 55 ?49 32.0         A.Elbow - Wrist      R Ulnar - ADM     Wrist ADM 2.6 ?3.3 12.2 ?6.0 100 Wrist - ADM 7   33.2     B.Elbow ADM 7.1  11.1  90.7 B.Elbow - Wrist 23 50 ?49 32.6     A.Elbow ADM 9.0  10.6  95.5 A.Elbow - B.Elbow 10 55 ?49 31.6         A.Elbow - Wrist                 SNC    Nerve / Sites Rec. Site Peak Lat Ref.  Amp Ref. Segments Distance Peak Diff Ref.    ms ms V V  cm ms ms  L Median, Ulnar - Transcarpal comparison     Median Palm Wrist 3.6 ?2.2 10 ?35 Median Palm -  Wrist 8       Ulnar Palm Wrist 2.0 ?2.2 8 ?12 Ulnar Palm - Wrist 8          Median Palm - Ulnar Palm  1.6 ?0.4  R Median, Ulnar - Transcarpal comparison     Median Palm Wrist 4.3 ?2.2 5 ?35 Median Palm - Wrist 8       Ulnar Palm Wrist 2.1 ?2.2 9 ?12 Ulnar Palm - Wrist 8          Median Palm - Ulnar Palm  2.2 ?0.4  L Median - Orthodromic (Dig II, Mid palm)     Dig II Wrist 4.7 ?3.4 4 ?10 Dig II - Wrist 13    R Median - Orthodromic (Dig II, Mid palm)     Dig II Wrist 5.8 ?3.4 2 ?10 Dig II - Wrist 13    L Ulnar - Orthodromic, (Dig V, Mid palm)     Dig V Wrist 2.8 ?3.1 6 ?5 Dig V - Wrist 11    R Ulnar - Orthodromic, (Dig V, Mid palm)     Dig V Wrist 2.8 ?3.1 13 ?5 Dig V - Wrist 75                   F  Wave    Nerve F Lat Ref.   ms ms  L Ulnar - ADM 31.9 ?32.0  R Ulnar - ADM 31.2 ?32.0         EMG full       EMG Summary Table    Spontaneous MUAP Recruitment  Muscle IA Fib PSW Fasc Other Amp Dur. Poly Pattern  L. Deltoid Normal None None None _______ Normal Normal Normal Normal  R. Deltoid Normal None None None _______ Normal Normal Normal Normal  L. Triceps  brachii Normal None None None _______ Normal Normal Normal Normal  R. Triceps brachii Normal None None None _______ Normal Normal Normal Normal  L. Pronator teres Normal None None None _______ Normal Normal Normal Normal  R. Pronator teres Normal None None None _______ Normal Normal Normal Normal  L. First dorsal interosseous Normal None None None _______ Normal Normal Normal Normal  R. First dorsal interosseous Normal None None None _______ Normal Normal Normal Normal  L. Opponens pollicis Normal None None None _______ Normal Normal Normal Normal  R. Opponens pollicis Normal None None None _______ Normal Normal Normal Normal

## 2021-08-14 ENCOUNTER — Other Ambulatory Visit (HOSPITAL_BASED_OUTPATIENT_CLINIC_OR_DEPARTMENT_OTHER): Payer: Self-pay

## 2021-08-14 ENCOUNTER — Ambulatory Visit: Payer: Self-pay | Attending: Internal Medicine

## 2021-08-14 DIAGNOSIS — Z23 Encounter for immunization: Secondary | ICD-10-CM

## 2021-08-14 MED ORDER — MODERNA COVID-19 BIVAL BOOSTER 50 MCG/0.5ML IM SUSP
INTRAMUSCULAR | 0 refills | Status: AC
  Filled 2021-08-14: qty 0.5, 1d supply, fill #0

## 2021-08-14 NOTE — Progress Notes (Signed)
   Covid-19 Vaccination Clinic  Name:  OLAMIDE CARATTINI    MRN: [id number] DOB: 1970/10/07  08/14/2021  Mr. Maradiaga was observed post Covid-19 immunization for 15 minutes without incident. He was provided with Vaccine Information Sheet and instruction to access the V-Safe system.   Mr. Williard was instructed to call 911 with any severe reactions post vaccine: Difficulty breathing  Swelling of face and throat  A fast heartbeat  A bad rash all over body  Dizziness and weakness   Immunizations Administered     Name Date Dose VIS Date Route   Moderna Covid-19 vaccine Bivalent Booster 08/14/2021 12:21 PM 0.5 mL 05/09/2021 Intramuscular   Manufacturer: Gala Murdoch   Lot: [id number]   NDC: 83729-021-11

## 2023-06-16 ENCOUNTER — Ambulatory Visit
Admission: RE | Admit: 2023-06-16 | Discharge: 2023-06-16 | Disposition: A | Discharge status: Home / Self Care | Payer: BC Managed Care – PPO | Source: Ambulatory Visit | Attending: Internal Medicine | Admitting: Internal Medicine

## 2023-06-16 ENCOUNTER — Other Ambulatory Visit: Payer: Self-pay | Admitting: Internal Medicine

## 2023-06-16 DIAGNOSIS — R109 Unspecified abdominal pain: Secondary | ICD-10-CM
